# Patient Record
Sex: Male | Born: 1980 | Race: White | Hispanic: No | Marital: Single
Health system: Southern US, Community
[De-identification: ages and names within clinical notes are randomized; demographics above are authoritative.]

---

## 2003-01-02 ENCOUNTER — Encounter: Admission: RE | Admit: 2003-01-02 | Discharge: 2003-01-02 | Payer: Self-pay | Admitting: Family Medicine

## 2003-12-11 ENCOUNTER — Emergency Department (HOSPITAL_COMMUNITY): Admission: EM | Admit: 2003-12-11 | Discharge: 2003-12-11 | Payer: Self-pay

## 2003-12-12 ENCOUNTER — Ambulatory Visit (HOSPITAL_BASED_OUTPATIENT_CLINIC_OR_DEPARTMENT_OTHER): Admission: RE | Admit: 2003-12-12 | Discharge: 2003-12-12 | Payer: Self-pay | Admitting: Orthopedic Surgery

## 2005-05-27 ENCOUNTER — Emergency Department (HOSPITAL_COMMUNITY): Admission: EM | Admit: 2005-05-27 | Discharge: 2005-05-27 | Payer: Self-pay | Admitting: Emergency Medicine

## 2006-04-30 DIAGNOSIS — H539 Unspecified visual disturbance: Secondary | ICD-10-CM

## 2007-03-02 IMAGING — CR DG WRIST COMPLETE 3+V*L*
2 series · 2 of 2 positions shown · non-contrast
Comparison: none

CLINICAL DATA: Fall with wrist pain on the left.  
 LEFT WRIST - 4 VIEW:
 There is soft tissue swelling, particularly dorsally.  No fracture or dislocation.

[view not recorded (1 of 2)]
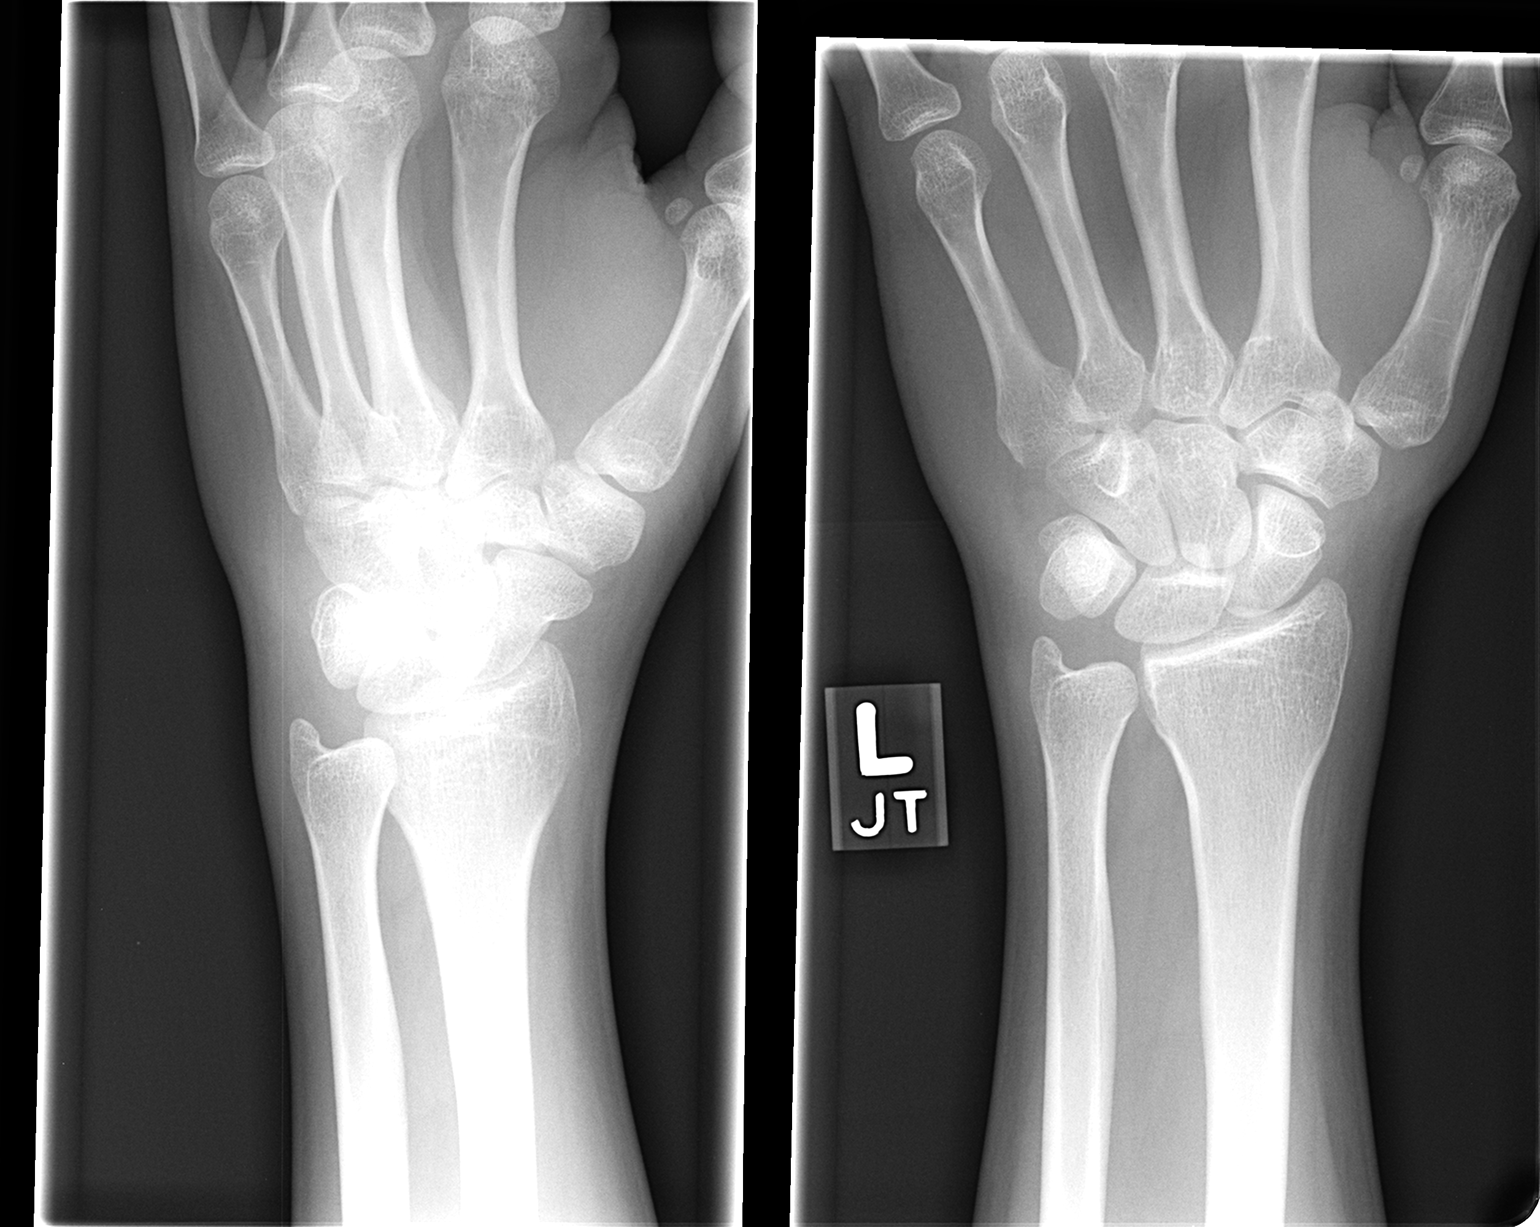

[view not recorded (2 of 2)]
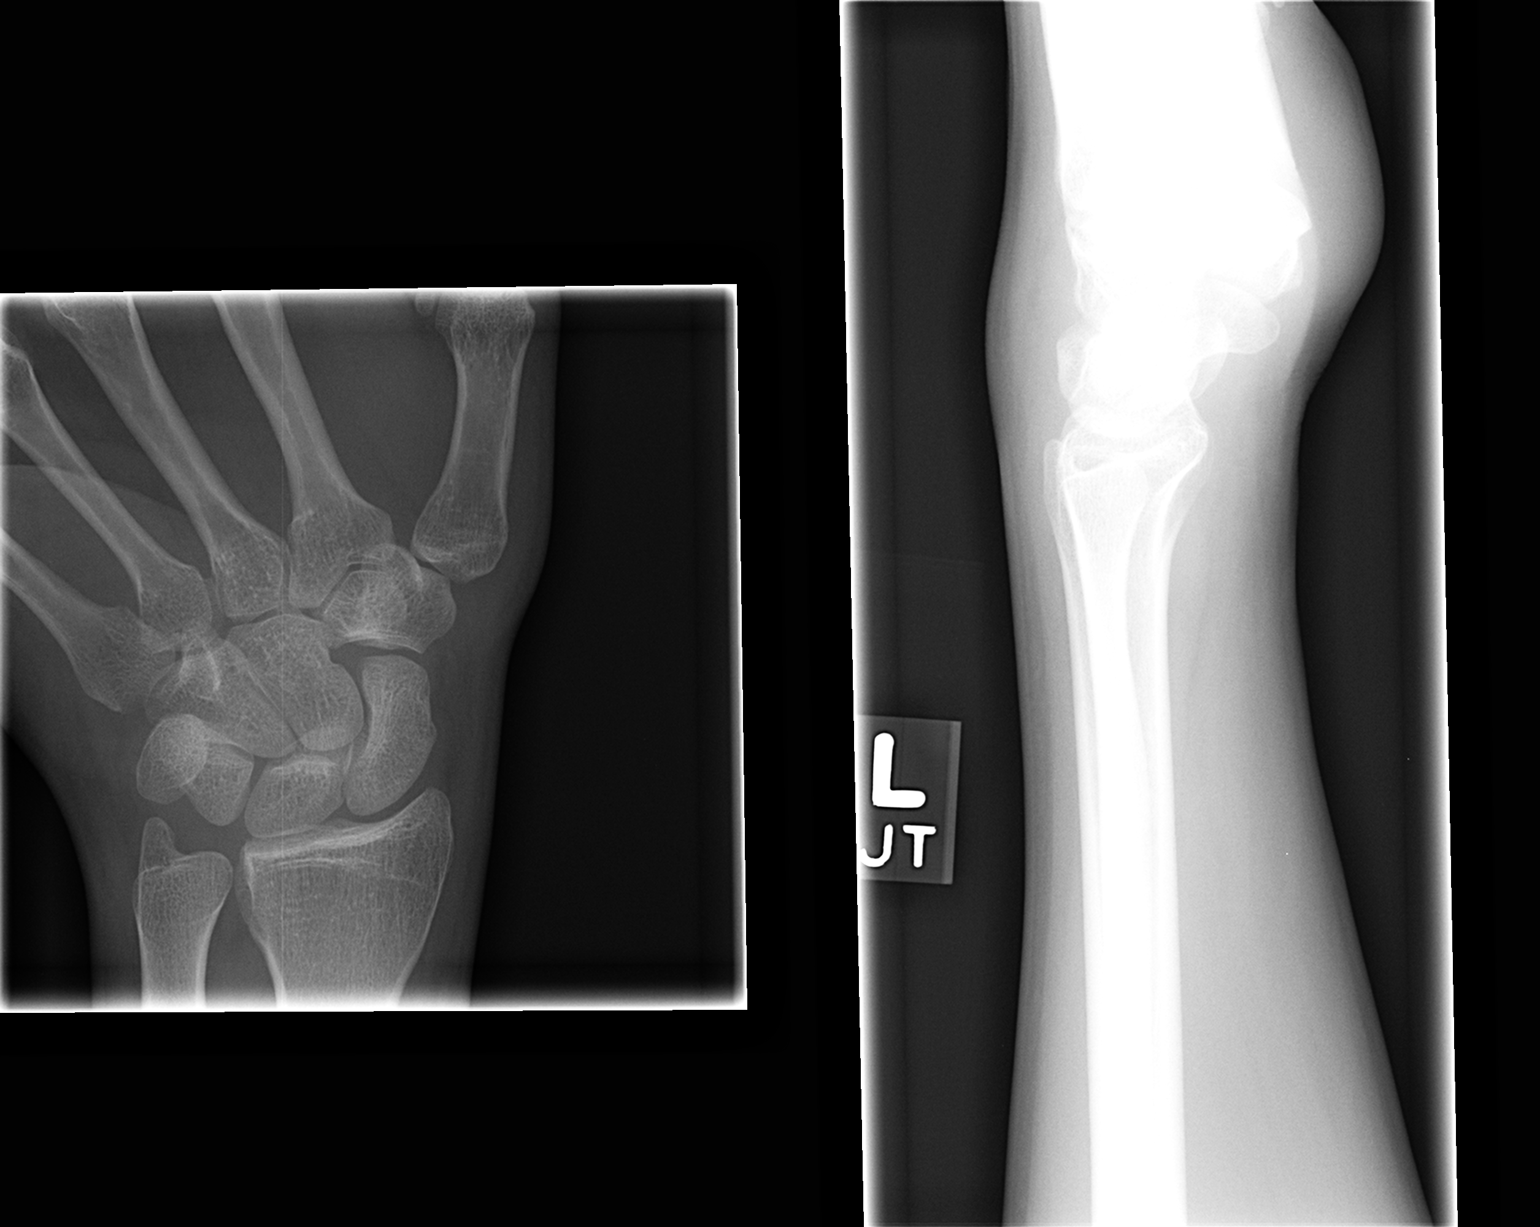

[2 of 2 positions shown; findings below may reference images not displayed]

IMPRESSION: As discussed above.

## 2014-04-13 ENCOUNTER — Telehealth: Payer: Self-pay | Admitting: *Deleted

## 2014-04-13 NOTE — Telephone Encounter (Signed)
error
